# Patient Record
Sex: Female | Born: 1935 | Race: White | Hispanic: No | Smoking: Never smoker
Health system: Southern US, Community
[De-identification: ages and names within clinical notes are randomized; demographics above are authoritative.]

---

## 2006-10-29 ENCOUNTER — Ambulatory Visit: Payer: Self-pay | Admitting: Nurse Practitioner

## 2009-07-08 ENCOUNTER — Ambulatory Visit (HOSPITAL_COMMUNITY): Admission: RE | Admit: 2009-07-08 | Discharge: 2009-07-08 | Payer: Self-pay | Admitting: Nurse Practitioner

## 2010-12-22 ENCOUNTER — Ambulatory Visit: Payer: Self-pay | Admitting: Nurse Practitioner

## 2010-12-31 ENCOUNTER — Ambulatory Visit: Payer: Self-pay | Admitting: Nurse Practitioner

## 2011-04-12 ENCOUNTER — Ambulatory Visit: Payer: Self-pay | Admitting: Nurse Practitioner

## 2014-12-13 ENCOUNTER — Ambulatory Visit (HOSPITAL_COMMUNITY)
Admission: RE | Admit: 2014-12-13 | Discharge: 2014-12-13 | Disposition: A | Discharge status: Home / Self Care | Payer: Medicare Other | Source: Ambulatory Visit | Attending: Cardiology | Admitting: Cardiology

## 2014-12-13 ENCOUNTER — Other Ambulatory Visit (HOSPITAL_COMMUNITY): Payer: Self-pay

## 2014-12-13 DIAGNOSIS — R011 Cardiac murmur, unspecified: Secondary | ICD-10-CM | POA: Insufficient documentation

## 2014-12-17 ENCOUNTER — Other Ambulatory Visit (HOSPITAL_COMMUNITY): Payer: Self-pay | Admitting: Nurse Practitioner

## 2014-12-17 DIAGNOSIS — R931 Abnormal findings on diagnostic imaging of heart and coronary circulation: Secondary | ICD-10-CM

## 2014-12-23 ENCOUNTER — Ambulatory Visit (HOSPITAL_COMMUNITY)
Admission: RE | Admit: 2014-12-23 | Discharge: 2014-12-23 | Disposition: A | Discharge status: Home / Self Care | Payer: Medicare Other | Source: Ambulatory Visit | Attending: Nurse Practitioner | Admitting: Nurse Practitioner

## 2014-12-23 DIAGNOSIS — R918 Other nonspecific abnormal finding of lung field: Secondary | ICD-10-CM | POA: Insufficient documentation

## 2014-12-23 DIAGNOSIS — Z853 Personal history of malignant neoplasm of breast: Secondary | ICD-10-CM | POA: Diagnosis not present

## 2014-12-23 DIAGNOSIS — J479 Bronchiectasis, uncomplicated: Secondary | ICD-10-CM | POA: Insufficient documentation

## 2014-12-23 DIAGNOSIS — R931 Abnormal findings on diagnostic imaging of heart and coronary circulation: Secondary | ICD-10-CM | POA: Insufficient documentation

## 2014-12-23 MED ORDER — IOHEXOL 350 MG/ML SOLN
100.0000 mL | Freq: Once | INTRAVENOUS | Status: AC | PRN
  Administered 2014-12-23: 100 mL via INTRAVENOUS

## 2015-01-14 DIAGNOSIS — E785 Hyperlipidemia, unspecified: Secondary | ICD-10-CM

## 2015-01-14 DIAGNOSIS — R413 Other amnesia: Secondary | ICD-10-CM

## 2015-01-14 DIAGNOSIS — E039 Hypothyroidism, unspecified: Secondary | ICD-10-CM | POA: Insufficient documentation

## 2015-01-14 DIAGNOSIS — F419 Anxiety disorder, unspecified: Secondary | ICD-10-CM | POA: Insufficient documentation

## 2015-01-14 DIAGNOSIS — F329 Major depressive disorder, single episode, unspecified: Secondary | ICD-10-CM | POA: Insufficient documentation

## 2015-01-14 DIAGNOSIS — E038 Other specified hypothyroidism: Secondary | ICD-10-CM

## 2015-01-14 DIAGNOSIS — G2581 Restless legs syndrome: Secondary | ICD-10-CM

## 2015-01-14 DIAGNOSIS — R011 Cardiac murmur, unspecified: Secondary | ICD-10-CM | POA: Insufficient documentation

## 2015-01-14 DIAGNOSIS — M810 Age-related osteoporosis without current pathological fracture: Secondary | ICD-10-CM

## 2015-01-14 DIAGNOSIS — F32A Depression, unspecified: Secondary | ICD-10-CM

## 2015-01-15 ENCOUNTER — Encounter: Payer: Self-pay | Admitting: Cardiovascular Disease

## 2015-01-15 ENCOUNTER — Ambulatory Visit (INDEPENDENT_AMBULATORY_CARE_PROVIDER_SITE_OTHER): Payer: Medicare Other | Admitting: Cardiovascular Disease

## 2015-01-15 VITALS — BP 138/78 | HR 78 | Ht 65.0 in | Wt 147.2 lb

## 2015-01-15 DIAGNOSIS — E785 Hyperlipidemia, unspecified: Secondary | ICD-10-CM

## 2015-01-15 DIAGNOSIS — I712 Thoracic aortic aneurysm, without rupture, unspecified: Secondary | ICD-10-CM

## 2015-01-15 DIAGNOSIS — I10 Essential (primary) hypertension: Secondary | ICD-10-CM | POA: Diagnosis not present

## 2015-01-15 DIAGNOSIS — I38 Endocarditis, valve unspecified: Secondary | ICD-10-CM | POA: Diagnosis not present

## 2015-01-15 NOTE — Progress Notes (Signed)
Patient ID: Kimberly Michael, female   DOB: [birth year], 79 y.o.   MRN: [id number]       CARDIOLOGY CONSULT NOTE  Patient ID: Kimberly Michael MRN: [id number] DOB/AGE: 1935/10/13 79 y.o.  Admit date: (Not on file) Primary Physician Lucia Estelle, NP  Reason for Consultation: ascending thoracic aortic aneurysm  HPI: The patient is a 79 year old woman who is referred for the evaluation of an ascending thoracic aortic aneurysm. A murmur was heard by her PCP who ordered an echocardiogram. An echocardiogram performed on 12/13/14 demonstrated normal left ventricular systolic function and regional wall motion, LVEF 55-60%, grade 1 diastolic dysfunction, moderately dilated ascending aorta with trivial aortic regurgitation. There was also mild tricuspid regurgitation.  To further characterize this, CT angiography of the chest was ordered and demonstrated dilation of the ascending thoracic aorta to 4.1 cm without evidence for dissection. There were atherosclerotic changes noted along the aortic arch and descending thoracic aorta with no significant stenosis. There were also mild chronic changes in the lungs with areas of bronchiectasis and minor areas of scarring as well as lung nodules, largest in the right lower lobe measuring 7 mm.  She also has a history of hyperlipidemia. Lipids on 11/22/14 showed total cholesterol 173, triglycerides 64, HDL 59, LDL 101.  She feels very well and denies chest pain, palpitations, shortness of breath, dizziness, and leg swelling. Her primary complaint relates to arthritis.  ECG performed in the office today demonstrates normal sinus rhythm with no ischemic ST segment or T-wave abnormalities, nor any arrhythmias.   No Known Allergies  Current Outpatient Prescriptions  Medication Sig Dispense Refill  . alendronate (FOSAMAX) 70 MG tablet Take 70 mg by mouth once a week. Take with a full glass of water on an empty stomach.    Marland Kitchen buPROPion (WELLBUTRIN XL) 300 MG 24 hr tablet  Take 300 mg by mouth daily.    . citalopram (CELEXA) 40 MG tablet Take 40 mg by mouth. Take 1/2 daily    . fexofenadine (ALLEGRA) 180 MG tablet Take 180 mg by mouth daily.    Marland Kitchen gabapentin (NEURONTIN) 300 MG capsule Take 300 mg by mouth 3 (three) times daily.     Marland Kitchen levothyroxine (SYNTHROID, LEVOTHROID) 100 MCG tablet Take 100 mcg by mouth daily before breakfast.    . montelukast (SINGULAIR) 10 MG tablet Take 10 mg by mouth at bedtime.     . pantoprazole (PROTONIX) 40 MG tablet Take 40 mg by mouth daily.     . simvastatin (ZOCOR) 40 MG tablet Take 40 mg by mouth daily at 6 PM.      No current facility-administered medications for this visit.    No past medical history on file.  No past surgical history on file.  Social History   Social History  . Marital Status: Divorced    Spouse Name: N/A  . Number of Children: N/A  . Years of Education: N/A   Occupational History  . Not on file.   Social History Main Topics  . Smoking status: Never Smoker   . Smokeless tobacco: Never Used  . Alcohol Use: Not on file  . Drug Use: Not on file  . Sexual Activity: Not on file   Other Topics Concern  . Not on file   Social History Narrative     No family history of premature CAD in 1st degree relatives.  Prior to Admission medications   Medication Sig Start Date End Date Taking? Authorizing Provider  alendronate (FOSAMAX) 70 MG  tablet Take 70 mg by mouth once a week. Take with a full glass of water on an empty stomach.   Yes Historical Provider, MD  buPROPion (WELLBUTRIN XL) 300 MG 24 hr tablet Take 300 mg by mouth daily.   Yes Historical Provider, MD  citalopram (CELEXA) 40 MG tablet Take 40 mg by mouth. Take 1/2 daily 05/15/07  Yes Historical Provider, MD  fexofenadine (ALLEGRA) 180 MG tablet Take 180 mg by mouth daily.   Yes Historical Provider, MD  gabapentin (NEURONTIN) 300 MG capsule Take 300 mg by mouth 3 (three) times daily.  01/11/13  Yes Historical Provider, MD  levothyroxine  (SYNTHROID, LEVOTHROID) 100 MCG tablet Take 100 mcg by mouth daily before breakfast.   Yes Historical Provider, MD  montelukast (SINGULAIR) 10 MG tablet Take 10 mg by mouth at bedtime.  12/16/11  Yes Historical Provider, MD  pantoprazole (PROTONIX) 40 MG tablet Take 40 mg by mouth daily.  12/24/11  Yes Historical Provider, MD  simvastatin (ZOCOR) 40 MG tablet Take 40 mg by mouth daily at 6 PM.  05/15/07  Yes Historical Provider, MD     Review of systems complete and found to be negative unless listed above in HPI     Physical exam Blood pressure 138/78, pulse 78, height 5\' 5"  (1.651 m), weight 147 lb 3.2 oz (66.769 kg), SpO2 97 %. General: NAD Neck: No JVD, no thyromegaly or thyroid nodule.  Lungs: Clear to auscultation bilaterally with normal respiratory effort. CV: Nondisplaced PMI. Regular rate and rhythm, normal S1/S2, no S3/S4, very soft 1/6 systolic murmur along left sternal border.  No peripheral edema.  No carotid bruit.   Abdomen: Soft, nontender, no distention.  Skin: Intact without lesions or rashes.  Neurologic: Alert and oriented x 3.  Psych: Normal affect. Extremities: No clubbing or cyanosis.  HEENT: Normal.   ECG: Most recent ECG reviewed.  Labs:  No results found for: WBC, HGB, HCT, MCV, PLT No results for input(s): NA, K, CL, CO2, BUN, CREATININE, CALCIUM, PROT, BILITOT, ALKPHOS, ALT, AST, GLUCOSE in the last 168 hours.  Invalid input(s): LABALBU No results found for: CKTOTAL, CKMB, CKMBINDEX, TROPONINI No results found for: CHOL No results found for: HDL No results found for: LDLCALC No results found for: TRIG No results found for: CHOLHDL No results found for: LDLDIRECT       Studies: No results found.  ASSESSMENT AND PLAN:  1. Ascending thoracic aortic aneurysm: Mildly dilated by CT angiography. BP is controlled. Will monitor with repeat CT angiography in 1 year.  2. Valvular heart disease: Only trivial aortic regurgitation with normal LV systolic  function.  3. Hyperlipidemia: Controlled with statin therapy with results reviewed above. She had questions regarding the need for statin therapy. I explained the benefits of primary prevention of cardiovascular disease with treatment for hyperlipidemia. She is in agreement. No changes.  Dispo: f/u 2 years.   Signed: Prentice DockerSuresh Koneswaran, M.D., F.A.C.C.  01/15/2015, 8:46 AM

## 2015-01-15 NOTE — Patient Instructions (Signed)
Medication Instruction: Your physician recommends that you continue on your current medications as directed. Please refer to the Current Medication list given to you today.   Labwork: none  Testing/Procedures: In 1 year you will need to repeat a CT Angiograph to check the thorasic aortic aneurism.   Follow-Up:  Your physician wants you to follow-up in: 1 year with Dr. Purvis SheffieldKoneswaran.  You will receive a reminder letter in the mail two months in advance. If you don't receive a letter, please call our office to schedule the follow-up appointment.   Any Other Special Instructions Will Be Listed Below (If Applicable).     If you need a refill on your cardiac medications before your next appointment, please call your pharmacy.  Thanks for choosing [facility]!!!

## 2015-01-15 NOTE — Addendum Note (Signed)
Addended by: Abelino DerrickMCGHEE, Tawona Filsinger R on: 01/15/2015 09:07 AM   Modules accepted: Orders

## 2015-06-26 ENCOUNTER — Other Ambulatory Visit (HOSPITAL_COMMUNITY): Payer: Self-pay | Admitting: Nurse Practitioner

## 2015-06-26 DIAGNOSIS — R918 Other nonspecific abnormal finding of lung field: Secondary | ICD-10-CM

## 2015-07-01 ENCOUNTER — Ambulatory Visit (HOSPITAL_COMMUNITY)
Admission: RE | Admit: 2015-07-01 | Discharge: 2015-07-01 | Disposition: A | Discharge status: Home / Self Care | Payer: Medicare Other | Source: Ambulatory Visit | Attending: Nurse Practitioner | Admitting: Nurse Practitioner

## 2015-07-01 DIAGNOSIS — I712 Thoracic aortic aneurysm, without rupture: Secondary | ICD-10-CM | POA: Insufficient documentation

## 2015-07-01 DIAGNOSIS — R918 Other nonspecific abnormal finding of lung field: Secondary | ICD-10-CM | POA: Diagnosis present

## 2015-07-01 MED ORDER — IOPAMIDOL (ISOVUE-300) INJECTION 61%
75.0000 mL | Freq: Once | INTRAVENOUS | Status: AC | PRN
  Administered 2015-07-01: 75 mL via INTRAVENOUS

## 2015-10-27 ENCOUNTER — Other Ambulatory Visit (HOSPITAL_COMMUNITY): Payer: Self-pay | Admitting: Nurse Practitioner

## 2015-10-27 DIAGNOSIS — M5442 Lumbago with sciatica, left side: Secondary | ICD-10-CM

## 2015-11-03 ENCOUNTER — Ambulatory Visit (HOSPITAL_COMMUNITY)
Admission: RE | Admit: 2015-11-03 | Discharge: 2015-11-03 | Disposition: A | Discharge status: Home / Self Care | Payer: Medicare Other | Source: Ambulatory Visit | Attending: Nurse Practitioner | Admitting: Nurse Practitioner

## 2015-11-03 DIAGNOSIS — M419 Scoliosis, unspecified: Secondary | ICD-10-CM | POA: Insufficient documentation

## 2015-11-03 DIAGNOSIS — M5442 Lumbago with sciatica, left side: Secondary | ICD-10-CM | POA: Diagnosis present

## 2015-11-03 DIAGNOSIS — M48061 Spinal stenosis, lumbar region without neurogenic claudication: Secondary | ICD-10-CM | POA: Diagnosis not present

## 2015-11-03 DIAGNOSIS — M2578 Osteophyte, vertebrae: Secondary | ICD-10-CM | POA: Insufficient documentation

## 2015-12-03 DEATH — deceased

## 2016-01-08 ENCOUNTER — Other Ambulatory Visit: Payer: Self-pay

## 2016-01-08 DIAGNOSIS — I712 Thoracic aortic aneurysm, without rupture, unspecified: Secondary | ICD-10-CM

## 2016-01-12 ENCOUNTER — Ambulatory Visit (HOSPITAL_COMMUNITY): Admission: RE | Admit: 2016-01-12 | Payer: Medicare Other | Source: Ambulatory Visit

## 2017-04-17 IMAGING — CT CT ANGIO CHEST
2 of 5 series · 18 of 46 positions shown · IV contrast (Omnipaque 300)
Comparison: None.

CLINICAL DATA: Patient had an abnormal echocardiogram. No current
chest complaints. History of right breast carcinoma 19 the fine.

EXAM:
CT ANGIOGRAPHY CHEST WITH CONTRAST
TECHNIQUE: Multidetector CT imaging of the chest was performed using the
standard protocol during bolus administration of intravenous
contrast. Multiplanar CT image reconstructions and MIPs were
obtained to evaluate the vascular anatomy.
CONTRAST:  100mL OMNIPAQUE IOHEXOL 350 MG/ML SOLN

[Series 5: thoracic aneur 3.0 b40f · axial · 0.66mm/px · z∈[-312,-12]mm · 16 of 108 slices shown]
[im 4/108  lung]
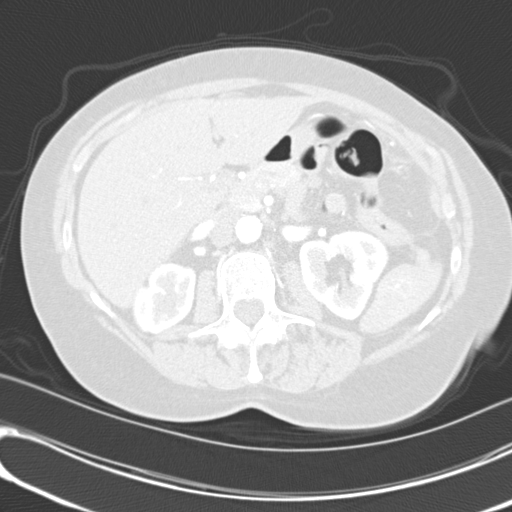
[im 11/108  soft-tissue]
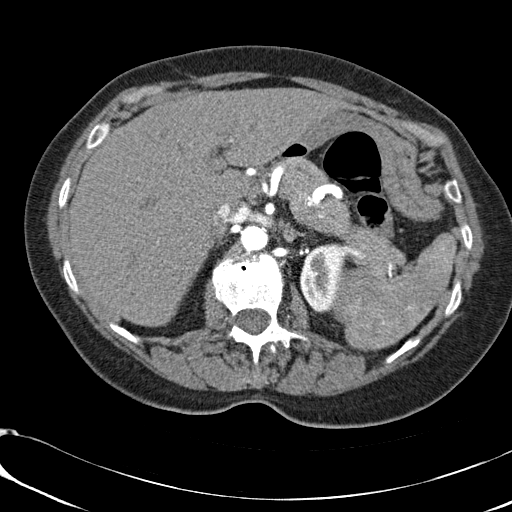
[im 18/108  lung]
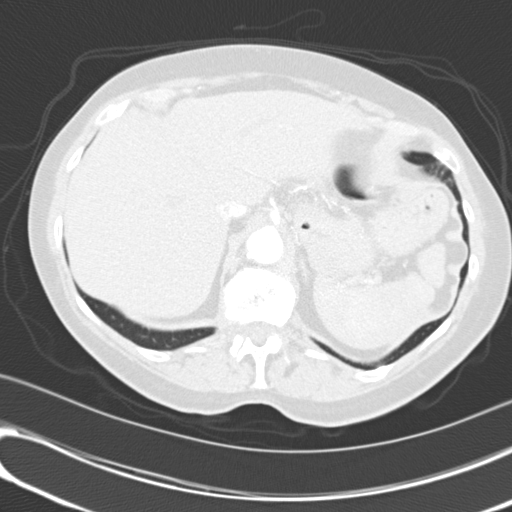
[im 25/108  soft-tissue]
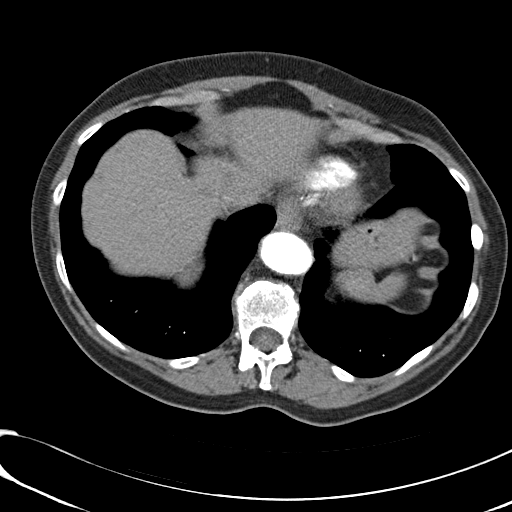
[im 32/108  lung]
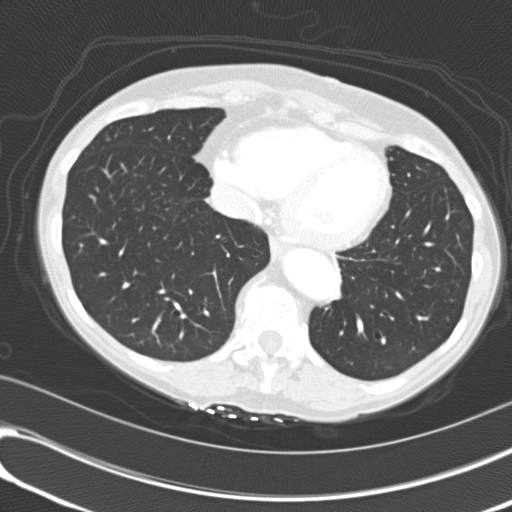
[im 38/108  soft-tissue]
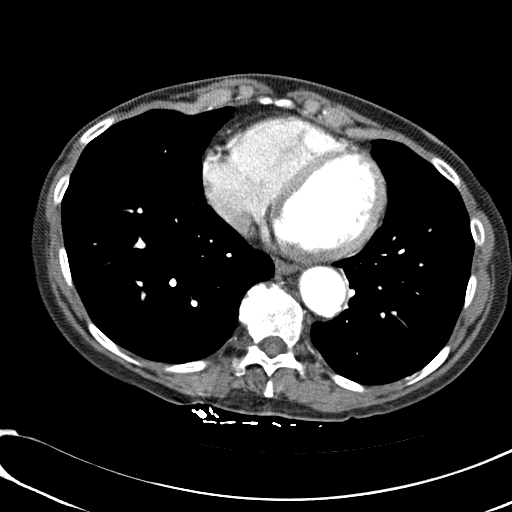
[im 45/108  lung]
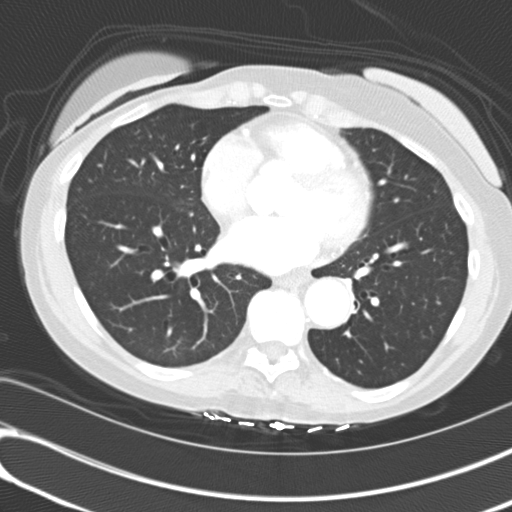
[im 52/108  soft-tissue]
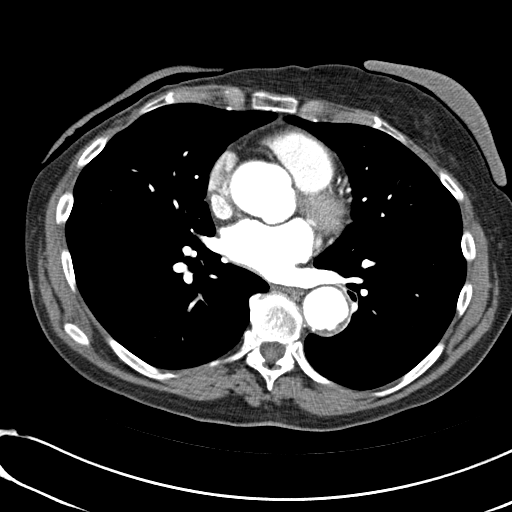
[im 56/108  lung]
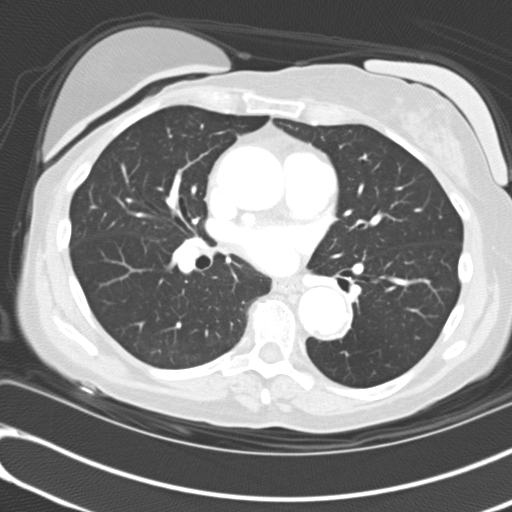
[im 63/108  soft-tissue]
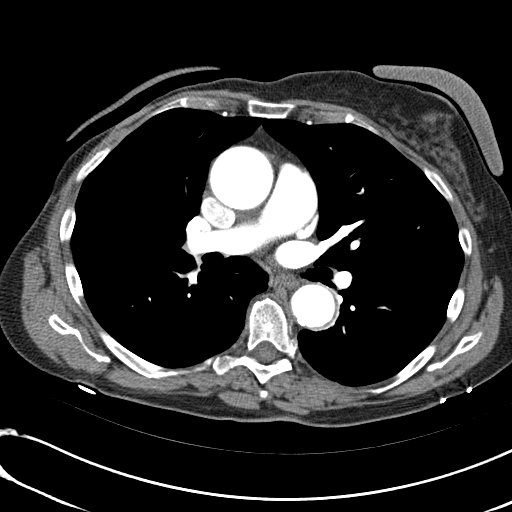
[im 70/108  lung]
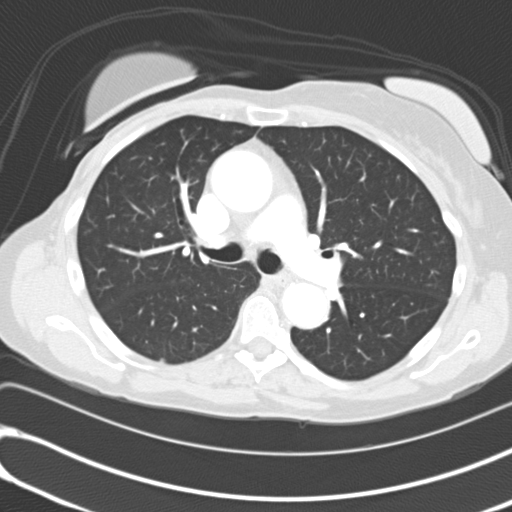
[im 76/108  soft-tissue]
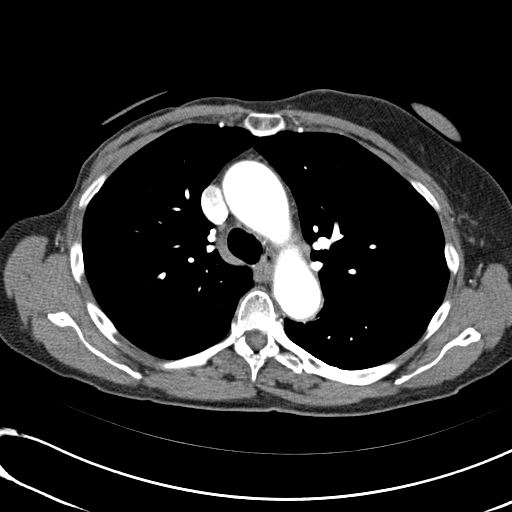
[im 83/108  lung]
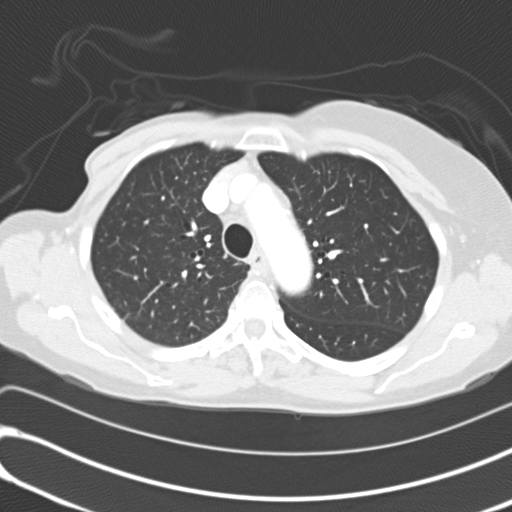
[im 90/108  soft-tissue]
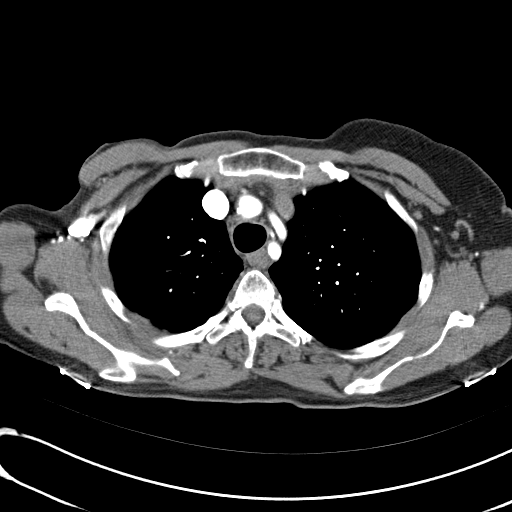
[im 97/108  lung]
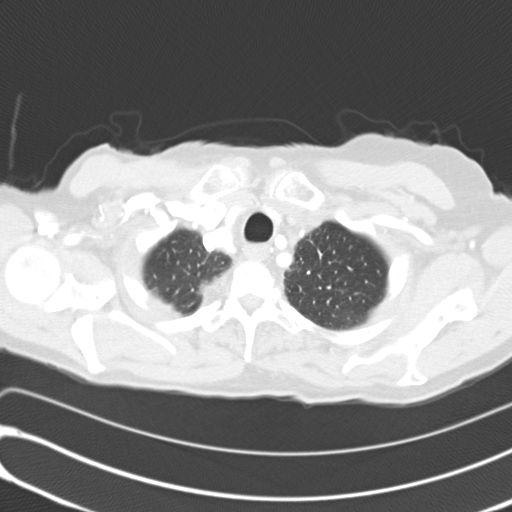
[im 104/108  soft-tissue]
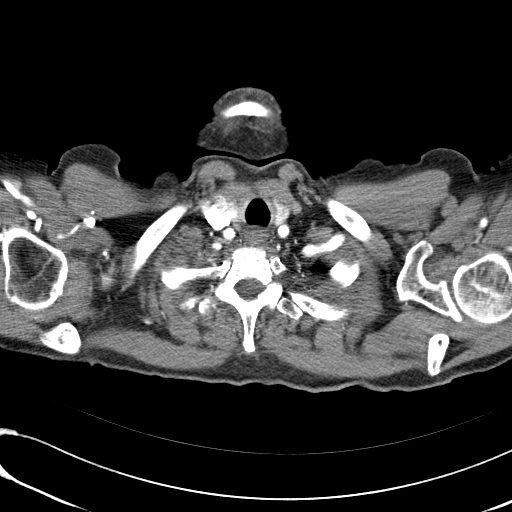

[Series 7: mpr cor post contrast · coronal · 0.63mm/px · 2 of 83 slices shown]
[im 28/83  soft-tissue]
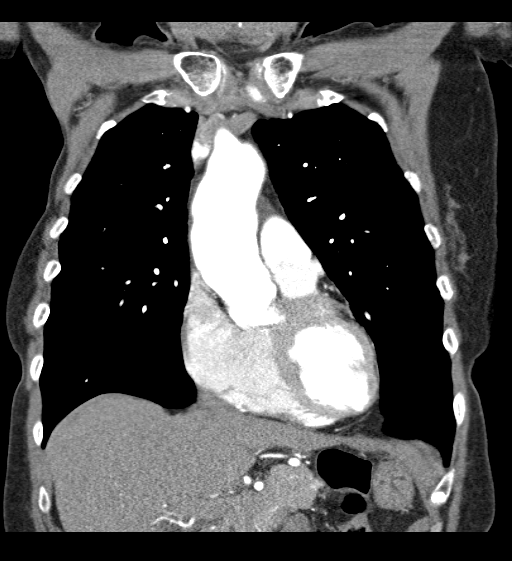
[im 55/83  soft-tissue]
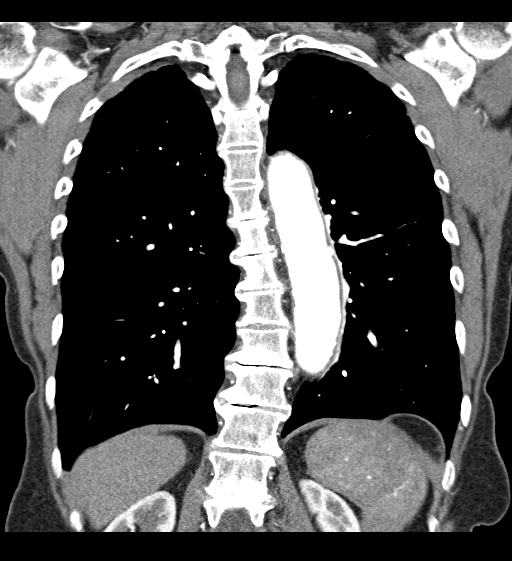

[18 of 46 positions shown; findings below may reference images not displayed]

FINDINGS: Angiographic study: There is dilation of the ascending aorta to a
maximum of 4.1 cm in diameter. Aorta decreases in diameter across
the arch and descending portion. There is mild partly calcified
plaque along the aortic arch and at the origin of the left
subclavian artery. More prominent plaque is noted along the
descending thoracic aorta with no significant stenosis. There is no
dissection. The pulmonary arteries are normal in caliber.

Neck base and axilla: No mass or adenopathy. Visualized thyroid is
unremarkable.

Mediastinum and hila: Heart normal in size and configuration. No
mediastinal or hilar masses or pathologically enlarged lymph nodes.

Lungs and pleura: 7 mm pleural-based nodule in the right lower lobe
posteriorly, image 48, series [DATE] mm nodule adjacent to the minor
fissure in the right upper lobe, image 46. No other concerning
nodules. There is no evidence of pneumonia or pulmonary edema. There
is bronchiectasis in the right upper lobe with minor associated
scarring. Mild bronchiectasis is noted in both lower lobes. There is
mild right and minimal left pleural parenchymal scarring at the
apices. Mild bronchiectasis is also noted in the upper lobes in the
apices, right greater than left. No pleural effusion. No
pneumothorax.

Limited upper abdomen:  No significant finding.

Musculoskeletal: Degenerative changes noted throughout the
visualized spine. No osteoblastic or osteolytic lesions.

Chest wall: Status post right mastectomy.

Review of the MIP images confirms the above findings.
IMPRESSION: 1. Dilation of the ascending thoracic aorta to 4.1 cm. No
dissection.
2. Atherosclerotic change noted along the aortic arch and descending
thoracic aorta with no significant stenosis.
3. Mild chronic changes in the lungs with areas of bronchiectasis
and minor areas scarring. No acute findings in the lungs.
4. Lung nodules, largest in the right lower lobe measuring 7 mm. If
the patient is at high risk for bronchogenic carcinoma, follow-up
chest CT at 3-9months is recommended. If the patient is at low risk
for bronchogenic carcinoma, follow-up chest CT at 6-12 months is
recommended. This recommendation follows the consensus statement:
Guidelines for Management of Small Pulmonary Nodules Detected on CT
Scans: A Statement from the [HOSPITAL] as published in
